# Patient Record
Sex: Female | Born: 2005 | Race: Black or African American | Hispanic: No | Marital: Single | Smoking: Never smoker
Health system: Southern US, Community
[De-identification: ages and names within clinical notes are randomized; demographics above are authoritative.]

## PROBLEM LIST (undated history)

## (undated) DIAGNOSIS — Z91018 Allergy to other foods: Secondary | ICD-10-CM

---
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
## 2025-01-25 ENCOUNTER — Ambulatory Visit: Admitting: Internal Medicine

## 2025-01-25 ENCOUNTER — Encounter: Payer: Self-pay | Admitting: Internal Medicine

## 2025-01-25 VITALS — HR 65 | Temp 97.3°F | Resp 18 | Ht <= 58 in | Wt 98.8 lb

## 2025-01-25 DIAGNOSIS — T7800XA Anaphylactic reaction due to unspecified food, initial encounter: Secondary | ICD-10-CM

## 2025-01-25 DIAGNOSIS — H1013 Acute atopic conjunctivitis, bilateral: Secondary | ICD-10-CM

## 2025-01-25 DIAGNOSIS — J3089 Other allergic rhinitis: Secondary | ICD-10-CM

## 2025-01-25 DIAGNOSIS — J4540 Moderate persistent asthma, uncomplicated: Secondary | ICD-10-CM | POA: Diagnosis not present

## 2025-01-25 DIAGNOSIS — L2084 Intrinsic (allergic) eczema: Secondary | ICD-10-CM

## 2025-01-25 DIAGNOSIS — T7800XD Anaphylactic reaction due to unspecified food, subsequent encounter: Secondary | ICD-10-CM | POA: Diagnosis not present

## 2025-01-25 DIAGNOSIS — J302 Other seasonal allergic rhinitis: Secondary | ICD-10-CM

## 2025-01-25 MED ORDER — ALBUTEROL SULFATE HFA 108 (90 BASE) MCG/ACT IN AERS: 2.0000 | INHALATION_SPRAY | RESPIRATORY_TRACT | 0 refills | Status: AC | PRN

## 2025-01-25 MED ORDER — CETIRIZINE HCL 10 MG PO TABS: ORAL_TABLET | ORAL | 5 refills | Status: AC

## 2025-01-25 MED ORDER — BUDESONIDE-FORMOTEROL FUMARATE 160-4.5 MCG/ACT IN AERO: 2.0000 | INHALATION_SPRAY | Freq: Two times a day (BID) | RESPIRATORY_TRACT | 5 refills | Status: AC

## 2025-01-25 MED ORDER — EPINEPHRINE 0.3 MG/0.3ML IJ SOAJ: 0.3000 mg | INTRAMUSCULAR | 1 refills | Status: AC | PRN

## 2025-01-25 MED ORDER — HYDROCORTISONE 2.5 % EX OINT: TOPICAL_OINTMENT | CUTANEOUS | 2 refills | Status: AC

## 2025-01-25 MED ORDER — ANZUPGO 20 MG/GM EX CREA: 1.0000 | TOPICAL_CREAM | Freq: Every day | CUTANEOUS | 3 refills | Status: AC | PRN

## 2025-01-25 MED ORDER — FLUTICASONE PROPIONATE 50 MCG/ACT NA SUSP: 1.0000 | Freq: Every day | NASAL | 5 refills | Status: AC

## 2025-01-25 MED ORDER — TRIAMCINOLONE ACETONIDE 0.1 % EX OINT: TOPICAL_OINTMENT | CUTANEOUS | 2 refills | Status: AC

## 2025-01-25 NOTE — Progress Notes (Signed)
 "  FOLLOW UP Date of Service/Encounter:   01/25/2025  Subjective:  Julie Hurley (DOB: 2006/05/24) is a 19 y.o. female who [facility] in re-evaluation of the following: asthma, allergic rhinitis, food allergy , eczema  History obtained from: chart review and patient.  For Review, LV was on 01/06/24  with Dr.Addie Cederberg seen for routine follow-up. See below for summary of history and diagnostics.   Therapeutic plans/changes recommended: increased wheezing episodes, mild obstruction on spirometry, FEV1 84%, improved to 12% following 4 puffs xopenex; labs obtained for biologics, updated food allergies and environmental allergies; recommend considering dupixent  and allergy  injections ----------------------------------------------------- Pertinent History/Diagnostics:  Asthma: Diagnosed at age 9 yo, multiple hospitalizations including 2 PICU stays. Multiple rounds of steroids in 2023, 2 ED visits 2023. + secondhand smoke exposure.  2016 AEC 600, 2022 CXR: mild central bronchial thickening Hospitalization 04/14/23 for asthma flare 04/14/23 AEC 800; CXR: hyperinflation of the lungs. Mild subsegmental atelectasis in mid right lung. -  spirometry (01/26/23): ratio 0.66, 72% FEV1 (pre), 72% FEV1 (post); mild obstruction, no bronchodilator response. Current meds: Symbicort  160 mcg 2 puffs BID - 01/06/24: AEC 400 - dupixent  started 04/2024, stopped 07/18/24 Allergic Rhinitis:  Runny nose, watery eyes, itchy eyes. Cough.  Worse in winter. Has seen ENT for a suspected benign tongue growth; removal offered but declined.  - SPT environmental panel (01/26/23): positive to grass pollen, weed pollen, tree pollen, indoor molds, dust mites, dog  Current meds: zyrtec , flonase  PRN, pataday  PRN - labs 01/06/24: positive to dust mites, cat, dog, grass pollen, cockroach, mold allergy , tree pollen, weed pollen Food Allergy :  Tested at age 46 yo after tongue swelling from eating  a fish sandwich; positive to shellfish, onion and peanut . Avoiding all seafood and nuts as well as onion. - SPT select foods (01/26/23): + tree nuts, fish, shellfish; negative to peanuts and onion - serum IgE 01/26/23: onion 9.35, high positive tree nuts, peanuts (55.5), shellfish and fish (positive) - labs 01/06/24: egg white 21.20, onion 6.70,  Eczema: Flares on her face, antecubital fossa, popliteal fossa.  She used to do wet wraps. Using triamicnolone PRN. --------------------------------------------------- Today presents for follow-up. Discussed the use of AI scribe software for clinical note transcription with the patient, who gave verbal consent to proceed.  History of Present Illness Julie Hurley is an 19 year old female with asthma and eczema who presents for medication refills and management of her conditions.  Asthma - Stable without recent flares or frequent wheezing - Uses Symbicort  twice daily; has run out and requests refill - Previously started Dupixent  injections; last dose July of previous year, discontinued due to scheduling issues - Willing to restart Dupixent  as it reduced need for rescue inhaler use  Eczematous dermatitis - No worsening of eczema; condition remains unchanged - Peeling present on hands - No significant improvement with Dupixent  - Interested in trying a new topical treatment for hands -current meds: triamcinolone  and hydrocortisone   Food allergies - Allergies to eggs, onions, fish, shellfish, peanuts, and tree nuts - No recent accidental exposures - requires EpiPen  refill at this time - Prefers EpiPen  over nasal spray for allergic reactions  Allergic rhinitis - No recent issues with allergies - Has not been using Flonase  or Zyrtec  regularly - Requests refills for Flonase , Zyrtec , and nasal spray   All medications reviewed by clinical staff and updated in chart. No new pertinent medical or surgical history except as noted in HPI.  ROS: All  others negative except as noted  per HPI.   Objective:  Pulse 65   Temp (!) 97.3 F (36.3 C) (Temporal)   Resp 18   Ht 4' 9.85 (1.469 m)   Wt 98 lb 12.8 oz (44.8 kg)   SpO2 96%   BMI 20.76 kg/m  Body mass index is 20.76 kg/m. Physical Exam: General Appearance:  Alert, cooperative, no distress, appears stated age  Head:  Normocephalic, without obvious abnormality, atraumatic  Eyes:  Conjunctiva clear, EOM's intact  Ears EACs normal bilaterally and normal TMs bilaterally  Nose: Nares normal, hypertrophic turbinates, normal mucosa, and no visible anterior polyps  Throat: Lips, tongue normal; teeth and gums normal, normal posterior oropharynx  Neck: Supple, symmetrical  Lungs:   clear to auscultation bilaterally, Respirations unlabored, no coughing  Heart:  regular rate and rhythm and no murmur, Appears well perfused  Extremities: No edema  Skin: erythematous, dry patches scattered on hands  Neurologic: No gross deficits   Labs:  Lab Orders  No laboratory test(s) ordered today    Spirometry:  Tracings reviewed. Her effort: Good reproducible efforts. FVC: 2.72L FEV1: 1.72L, 63% predicted FEV1/FVC ratio: 0.63 Interpretation: Spirometry consistent with moderate obstructive disease.  Please see scanned spirometry results for details.  Assessment/Plan   Moderate Persistent Asthma: not at goal - your lung testing today showed moderate obstruction - Controller Inhaler: Continue Symbicort  160 mcg 2 puffs twice a day; This Should Be Used Everyday - Rinse mouth out after use - Rescue Inhaler: Albuterol  (Proair /Ventolin ) 2 puffs . Use every 4-6 hours as needed for chest tightness, wheezing, or coughing.  Can also use 15 minutes prior to exercise if you have symptoms with activity. - Asthma is not controlled if:  - Symptoms are occurring >2 times a week OR  - >2 times a month nighttime awakenings  - You are requiring systemic steroids (prednisone/steroid injections) more than once  per year  - Your require hospitalization for your asthma.  - Please call the clinic to schedule a follow up if these symptoms arise  Will resubmit for dupixent  to get restarted.  Seasonal and Perennial Allergic Rhinitis-at goal - allergen avoidance towards grass pollen, weed pollen, tree pollen, indoor molds, dust mites, dog - Prevention:  - allergen avoidance when possible - consider allergy  shots as long term control of your symptoms by teaching your immune system to be more tolerant of your allergy  triggers - Symptom control: - Consider Flonase  (fluticasone ) 1- 2 sprays in each nostril daily.  - Continue Zyrtec  (Cetirizine ) 10mg  daily. May take 2 daily if needed on bad allergy  days.  Allergic Conjunctivitis: at goal - Consider Pataday  (Olopatadine )  for eye symptoms daily as needed  Atopic Dermatitis: hands with flare Daily Care For Maintenance (daily and continue even once eczema controlled) - Use hypoallergenic hydrating ointment at least twice daily.  This must be done daily for control of flares. (Great options include Vaseline, CeraVe, Aquaphor, Aveeno, Cetaphil, VaniCream, etc) - Avoid detergents, soaps or lotions with fragrances/dyes - Limit showers/baths to 5 minutes and use luke warm water instead of hot, pat dry following baths, and apply moisturizer - can use steroid/non-steroid therapy creams as detailed below up to twice weekly for prevention of flares.  For Flares:(add this to maintenance therapy if needed for flares) First apply steroid/non-steroid treatment creams. Wait 5 minutes then apply moisturizer.  - Triamcinolone  0.1% to body for moderate flares-apply topically twice daily to red, raised areas of skin, followed by moisturizer. Do NOT use on face, groin or armpits. - Hydrocortisone   2.5% to face/body-apply topically twice daily to red, raised areas of skin, followed by moisturizer - anzupgo  daily as needed for hands only  Food allergy : stable - please strictly  avoid shellfish, fish, peanuts and tree nuts, onions and eggs (tolerates baked egg) - for SKIN only reaction, okay to take Benadryl 4 teaspoonfuls every 6 hours - for SKIN + ANY additional symptoms, OR IF concern for LIFE THREATENING reaction = Epipen  Autoinjector EpiPen  0.3 mg. - If using Epinephrine  autoinjector, call 911 - A food allergy  action plan has been provided and discussed. - Medic Alert identification is recommended.  Follow up : 6  months, sooner if needed  It was a pleasure seeing you again in clinic today! Thank you for allowing me to participate in your care.  Other: none  Rocky Endow, MD  [facility]        "

## 2025-01-25 NOTE — Patient Instructions (Addendum)
 Moderate Persistent Asthma: - your lung testing today showed moderate obstruction - Controller Inhaler: Continue Symbicort  160 mcg 2 puffs twice a day; This Should Be Used Everyday - Rinse mouth out after use - Rescue Inhaler: Albuterol  (Proair /Ventolin ) 2 puffs . Use every 4-6 hours as needed for chest tightness, wheezing, or coughing.  Can also use 15 minutes prior to exercise if you have symptoms with activity. - Asthma is not controlled if:  - Symptoms are occurring >2 times a week OR  - >2 times a month nighttime awakenings  - You are requiring systemic steroids (prednisone/steroid injections) more than once per year  - Your require hospitalization for your asthma.  - Please call the clinic to schedule a follow up if these symptoms arise  Will resubmit for dupixent  to get restarted.  Seasonal and Perennial Allergic Rhinitis - allergen avoidance towards grass pollen, weed pollen, tree pollen, indoor molds, dust mites, dog - Prevention:  - allergen avoidance when possible - consider allergy  shots as long term control of your symptoms by teaching your immune system to be more tolerant of your allergy  triggers - Symptom control: - Consider Flonase  (fluticasone ) 1- 2 sprays in each nostril daily.  - Continue Zyrtec  (Cetirizine ) 10mg  daily. May take 2 daily if needed on bad allergy  days.  Allergic Conjunctivitis:  - Consider Pataday  (Olopatadine )  for eye symptoms daily as needed  Atopic Dermatitis:  Daily Care For Maintenance (daily and continue even once eczema controlled) - Use hypoallergenic hydrating ointment at least twice daily.  This must be done daily for control of flares. (Great options include Vaseline, CeraVe, Aquaphor, Aveeno, Cetaphil, VaniCream, etc) - Avoid detergents, soaps or lotions with fragrances/dyes - Limit showers/baths to 5 minutes and use luke warm water instead of hot, pat dry following baths, and apply moisturizer - can use steroid/non-steroid therapy creams  as detailed below up to twice weekly for prevention of flares.  For Flares:(add this to maintenance therapy if needed for flares) First apply steroid/non-steroid treatment creams. Wait 5 minutes then apply moisturizer.  - Triamcinolone  0.1% to body for moderate flares-apply topically twice daily to red, raised areas of skin, followed by moisturizer. Do NOT use on face, groin or armpits. - Hydrocortisone  2.5% to face/body-apply topically twice daily to red, raised areas of skin, followed by moisturizer - anzupgo  daily as needed for hands only  Food allergy :  - please strictly avoid shellfish, fish, peanuts and tree nuts, onions and eggs (tolerates baked egg) - for SKIN only reaction, okay to take Benadryl 4 teaspoonfuls every 6 hours - for SKIN + ANY additional symptoms, OR IF concern for LIFE THREATENING reaction = Epipen  Autoinjector EpiPen  0.3 mg. - If using Epinephrine  autoinjector, call 911 - A food allergy  action plan has been provided and discussed. - Medic Alert identification is recommended.  Follow up : 6  months, sooner if needed  It was a pleasure seeing you again in clinic today! Thank you for allowing me to participate in your care.  Rocky Endow, MD [facility]  Reducing Pollen Exposure  The American Academy of Allergy , Asthma and Immunology suggests the following steps to reduce your exposure to pollen during allergy  seasons.    Do not hang sheets or clothing out to dry; pollen may collect on these items. Do not mow lawns or spend time around freshly cut grass; mowing stirs up pollen. Keep windows closed at night.  Keep car windows closed while driving. Minimize morning activities outdoors, a time  when pollen counts are usually at their highest. Stay indoors as much as possible when pollen counts or humidity is high and on windy days when pollen tends to remain in the air longer. Use air conditioning when possible.  Many air conditioners have filters  that trap the pollen spores. Use a HEPA room air filter to remove pollen form the indoor air you breathe. Control of Dog or Cat Allergen  Avoidance is the best way to manage a dog or cat allergy . If you have a dog or cat and are allergic to dog or cats, consider removing the dog or cat from the home. If you have a dog or cat but dont want to find it a new home, or if your family wants a pet even though someone in the household is allergic, here are some strategies that may help keep symptoms at bay:  Keep the pet out of your bedroom and restrict it to only a few rooms. Be advised that keeping the dog or cat in only one room will not limit the allergens to that room. Dont pet, hug or kiss the dog or cat; if you do, wash your hands with soap and water. High-efficiency particulate air (HEPA) cleaners run continuously in a bedroom or living room can reduce allergen levels over time. Regular use of a high-efficiency vacuum cleaner or a central vacuum can reduce allergen levels. Giving your dog or cat a bath at least once a week can reduce airborne allergen. Control of Mold Allergen   Mold and fungi can grow on a variety of surfaces provided certain temperature and moisture conditions exist.  Outdoor molds grow on plants, decaying vegetation and soil.  The major outdoor mold, Alternaria and Cladosporium, are found in very high numbers during hot and dry conditions.  Generally, a late Summer - Fall peak is seen for common outdoor fungal spores.  Rain will temporarily lower outdoor mold spore count, but counts rise rapidly when the rainy period ends.  The most important indoor molds are Aspergillus and Penicillium.  Dark, humid and poorly ventilated basements are ideal sites for mold growth.  The next most common sites of mold growth are the bathroom and the kitchen.  Outdoor (Seasonal) Mold Control  Use air conditioning and keep windows closed Avoid exposure to decaying vegetation. Avoid leaf  raking. Avoid grain handling. Consider wearing a face mask if working in moldy areas.    Indoor (Perennial) Mold Control   Maintain humidity below 50%. Clean washable surfaces with 5% bleach solution. Remove sources e.g. contaminated carpets.   DUST MITE AVOIDANCE MEASURES:  There are three main measures that need and can be taken to avoid house dust mites:  Reduce accumulation of dust in general -reduce furniture, clothing, carpeting, books, stuffed animals, especially in bedroom  Separate yourself from the dust -use pillow and mattress encasements (can be found at stores such as [employer] or online) -avoid direct exposure to air condition flow -use a HEPA filter device, especially in the bedroom; you can also use a HEPA filter vacuum cleaner -wipe dust with a moist towel instead of a dry towel or broom when cleaning  Decrease mites and/or their secretions -wash clothing and linen and stuffed animals at highest temperature possible, at least every 2 weeks -stuffed animals can also be placed in a bag and put in a freezer overnight  Despite the above measures, it is impossible to eliminate dust mites or their allergen completely from your home.  With  the above measures the burden of mites in your home can be diminished, with the goal of minimizing your allergic symptoms.  Success will be reached only when implementing and using all means together.

## 2025-01-31 ENCOUNTER — Ambulatory Visit

## 2025-07-26 ENCOUNTER — Ambulatory Visit: Admitting: Internal Medicine
